# Patient Record
Sex: Female | Born: 1995 | Hispanic: Yes | Marital: Single | Smoking: Never smoker
Health system: Southern US, Community
[De-identification: ages and names within clinical notes are randomized; demographics above are authoritative.]

## PROBLEM LIST (undated history)

## (undated) DIAGNOSIS — J45909 Unspecified asthma, uncomplicated: Secondary | ICD-10-CM

## (undated) DIAGNOSIS — F79 Unspecified intellectual disabilities: Secondary | ICD-10-CM

---

## 2016-07-10 ENCOUNTER — Other Ambulatory Visit: Payer: Self-pay | Admitting: Gastroenterology

## 2016-07-10 DIAGNOSIS — R110 Nausea: Secondary | ICD-10-CM

## 2016-07-26 ENCOUNTER — Encounter (HOSPITAL_COMMUNITY)
Admission: RE | Admit: 2016-07-26 | Discharge: 2016-07-26 | Disposition: A | Discharge status: Home / Self Care | Payer: BLUE CROSS/BLUE SHIELD | Source: Ambulatory Visit | Attending: Gastroenterology | Admitting: Gastroenterology

## 2016-07-26 ENCOUNTER — Encounter (HOSPITAL_COMMUNITY): Payer: Self-pay | Admitting: Radiology

## 2016-07-26 DIAGNOSIS — R110 Nausea: Secondary | ICD-10-CM | POA: Diagnosis not present

## 2016-07-26 MED ORDER — TECHNETIUM TC 99M MEBROFENIN IV KIT
5.2000 | PACK | Freq: Once | INTRAVENOUS | Status: AC | PRN
  Administered 2016-07-26: 5.2 via INTRAVENOUS

## 2016-07-28 ENCOUNTER — Ambulatory Visit (INDEPENDENT_AMBULATORY_CARE_PROVIDER_SITE_OTHER): Payer: BLUE CROSS/BLUE SHIELD

## 2016-07-28 ENCOUNTER — Encounter (HOSPITAL_COMMUNITY): Payer: Self-pay | Admitting: Family Medicine

## 2016-07-28 ENCOUNTER — Ambulatory Visit (HOSPITAL_COMMUNITY)
Admission: EM | Admit: 2016-07-28 | Discharge: 2016-07-28 | Disposition: A | Discharge status: Home / Self Care | Payer: BLUE CROSS/BLUE SHIELD | Attending: Family Medicine | Admitting: Family Medicine

## 2016-07-28 DIAGNOSIS — S93402A Sprain of unspecified ligament of left ankle, initial encounter: Secondary | ICD-10-CM

## 2016-07-28 NOTE — Discharge Instructions (Signed)
Please take ibuprofen for pain relief Please rest the extremity Please follow up with your doctor if your ankle pain does not resolve

## 2016-07-28 NOTE — ED Triage Notes (Signed)
Pt here for pain and swelling to left ankle. sts that she fell on the bus yesterday.

## 2016-07-28 NOTE — ED Provider Notes (Signed)
CSN: [id number]     Arrival date & time 07/28/16  1401 History   None    Chief Complaint  Patient presents with  . Ankle Pain   (Consider location/radiation/quality/duration/timing/severity/associated sxs/prior Treatment) Patient is a healthy 21 year old female, is here for left ankle pain.  Patient was getting on the bus when she tripped on the steps and fell. Patient reports to have pain since the fall. Pain is tolerable currently. She reports limited ROM. She reports unable to ambulate. Patient have been applying ice and taking ibuprofen.       History reviewed. No pertinent past medical history. History reviewed. No pertinent surgical history. History reviewed. No pertinent family history. Social History  Substance Use Topics  . Smoking status: Never Smoker  . Smokeless tobacco: Never Used  . Alcohol use Not on file   OB History    No data available     Review of Systems  All other systems reviewed and are negative.   Allergies  Patient has no known allergies.  Home Medications   Prior to Admission medications   Not on File   Meds Ordered and Administered this Visit  Medications - No data to display  BP 132/81   Pulse 84   Temp 98.1 F (36.7 C)   Resp 18   SpO2 99%  No data found.   Physical Exam  Constitutional: She appears well-developed and well-nourished.  Cardiovascular: Normal rate.   Pulmonary/Chest: Effort normal.  Musculoskeletal:  Left ankle is swollen, tender to palpate over the lateral aspect; has limited ROM.   Skin: Skin is warm and dry.  Nursing note and vitals reviewed.   Urgent Care Course     Procedures (including critical care time)  Labs Review Labs Reviewed - No data to display  Imaging Review Dg Ankle Complete Left  Result Date: 07/28/2016 CLINICAL DATA:  Left ankle pain after injury last night EXAM: LEFT ANKLE COMPLETE - 3+ VIEW COMPARISON:  None. FINDINGS: Mild diffuse left ankle soft tissue swelling. No fracture or  subluxation. No suspicious focal osseous lesion. No appreciable degenerative or erosive arthropathy. No radiopaque foreign body. IMPRESSION: Mild diffuse left ankle soft tissue swelling, with no fracture or subluxation. Electronically Signed   By: Delbert PhenixJason A Poff M.D.   On: 07/28/2016 16:12    MDM   1. Sprain of left ankle, unspecified ligament, initial encounter    1) Xray unremarkable 2) Ankle brace applied to help with immobility 3) Take ibuprofen for pain relief 4) Rest the extremity 5) F/u with PCP if pain does not resolve in a few weeks.  6) Return as needed    Lucia EstelleFeng Sehaj Kolden, NP 07/28/16 1625

## 2016-10-19 ENCOUNTER — Other Ambulatory Visit: Payer: Self-pay

## 2016-10-19 ENCOUNTER — Encounter (HOSPITAL_COMMUNITY): Payer: Self-pay | Admitting: Emergency Medicine

## 2016-10-19 ENCOUNTER — Emergency Department (HOSPITAL_COMMUNITY)
Admission: EM | Admit: 2016-10-19 | Discharge: 2016-10-20 | Disposition: A | Discharge status: Home / Self Care | Payer: BLUE CROSS/BLUE SHIELD | Attending: Emergency Medicine | Admitting: Emergency Medicine

## 2016-10-19 ENCOUNTER — Emergency Department (HOSPITAL_COMMUNITY): Payer: BLUE CROSS/BLUE SHIELD

## 2016-10-19 DIAGNOSIS — J45909 Unspecified asthma, uncomplicated: Secondary | ICD-10-CM | POA: Insufficient documentation

## 2016-10-19 DIAGNOSIS — R079 Chest pain, unspecified: Secondary | ICD-10-CM | POA: Diagnosis present

## 2016-10-19 DIAGNOSIS — K219 Gastro-esophageal reflux disease without esophagitis: Secondary | ICD-10-CM | POA: Diagnosis not present

## 2016-10-19 DIAGNOSIS — R072 Precordial pain: Secondary | ICD-10-CM

## 2016-10-19 HISTORY — DX: Unspecified asthma, uncomplicated: J45.909

## 2016-10-19 HISTORY — DX: Unspecified intellectual disabilities: F79

## 2016-10-19 LAB — CBC WITH DIFFERENTIAL/PLATELET
BASOS ABS: 0 10*3/uL (ref 0.0–0.1)
BASOS PCT: 0 %
EOS ABS: 0.2 10*3/uL (ref 0.0–0.7)
EOS PCT: 1 %
HCT: 40.1 % (ref 36.0–46.0)
Hemoglobin: 13.6 g/dL (ref 12.0–15.0)
Lymphocytes Relative: 32 %
Lymphs Abs: 3.7 10*3/uL (ref 0.7–4.0)
MCH: 27.4 pg (ref 26.0–34.0)
MCHC: 33.9 g/dL (ref 30.0–36.0)
MCV: 80.7 fL (ref 78.0–100.0)
MONO ABS: 0.6 10*3/uL (ref 0.1–1.0)
Monocytes Relative: 5 %
Neutro Abs: 7.2 10*3/uL (ref 1.7–7.7)
Neutrophils Relative %: 62 %
PLATELETS: 379 10*3/uL (ref 150–400)
RBC: 4.97 MIL/uL (ref 3.87–5.11)
RDW: 14.4 % (ref 11.5–15.5)
WBC: 11.6 10*3/uL — AB (ref 4.0–10.5)

## 2016-10-19 LAB — BASIC METABOLIC PANEL
Anion gap: 12 (ref 5–15)
BUN: 9 mg/dL (ref 6–20)
CALCIUM: 8.9 mg/dL (ref 8.9–10.3)
CO2: 24 mmol/L (ref 22–32)
CREATININE: 0.94 mg/dL (ref 0.44–1.00)
Chloride: 102 mmol/L (ref 101–111)
GFR calc Af Amer: >60 mL/min (ref >60)
GLUCOSE: 153 mg/dL — AB (ref 65–99)
POTASSIUM: 3.1 mmol/L — AB (ref 3.5–5.1)
SODIUM: 138 mmol/L (ref 135–145)

## 2016-10-19 LAB — D-DIMER, QUANTITATIVE (NOT AT ARMC)

## 2016-10-19 LAB — I-STAT TROPONIN, ED: TROPONIN I, POC: 0 ng/mL (ref 0.00–0.08)

## 2016-10-19 NOTE — ED Triage Notes (Signed)
Per EMS; Pt was eating dinner and started to have CP.  Pt also c/o jaw hurting. A&Ox4 VSS Given 324 of ASP.   Poor historian

## 2016-10-19 NOTE — ED Provider Notes (Signed)
[facility]-EMERGENCY DEPT Provider Note   CSN: [id number] Arrival date & time: 10/19/16  2125     History   Chief Complaint Chief Complaint  Patient presents with  . Chest Pain    HPI Kelly Sloan is a 21 y.o. female.  Kelly Sloan is a 21 y.o. Female with history of MR and asthma who presents to the ED with her advisor from her school complaining of central chest pain since 7:30 pm tonight. Patient reports she was sitting eating dinner when she began having substernal and nonradiating chest pain. She denies any associated shortness of breath. She does report some associated burping. She denies any abdominal pain or vomiting. She denies history of MI or PE. No recent long travel. No personal or close family history of blood clotting disorder such as factor V Leiden, protein C or protein S deficiency. She is on endogenous estrogens on birth control pills. She denies fevers, coughing, shortness of breath, palpitations, leg pain, leg swelling, vomiting, diarrhea, abdominal pain, numbness, weakness or rashes.   The history is provided by the patient, medical records and a caregiver. No language interpreter was used.  Chest Pain   Associated symptoms include nausea. Pertinent negatives include no abdominal pain, no back pain, no cough, no fever, no headaches, no numbness, no palpitations, no shortness of breath, no vomiting and no weakness.    Past Medical History:  Diagnosis Date  . Asthma   . MR (mental retardation)     There are no active problems to display for this patient.   No past surgical history on file.  OB History    No data available       Home Medications    Prior to Admission medications   Medication Sig Start Date End Date Taking? Authorizing Provider  albuterol (PROVENTIL HFA;VENTOLIN HFA) 108 (90 Base) MCG/ACT inhaler Inhale 1-2 puffs into the lungs every 6 (six) hours as needed for wheezing or shortness of breath.   Yes Historical Provider, MD  Diethylpropion  HCl 25 MG TABS Take 1 tablet by mouth daily as needed (anxiety).    Yes Historical Provider, MD  escitalopram (LEXAPRO) 10 MG tablet Take 10 mg by mouth daily.   Yes Historical Provider, MD  levonorgestrel-ethinyl estradiol (SEASONALE,INTROVALE,JOLESSA) 0.15-0.03 MG tablet Take 1 tablet by mouth daily.   Yes Historical Provider, MD  naproxen (NAPROSYN) 250 MG tablet Take 1 tablet (250 mg total) by mouth 2 (two) times daily with a meal. 10/20/16   Everlene Farrier, PA-C  omeprazole (PRILOSEC) 20 MG capsule Take 1 capsule (20 mg total) by mouth daily. 10/20/16   Everlene Farrier, PA-C    Family History No family history on file.  Social History Social History  Substance Use Topics  . Smoking status: Never Smoker  . Smokeless tobacco: Never Used  . Alcohol use Not on file     Allergies   Patient has no known allergies.   Review of Systems Review of Systems  Constitutional: Negative for chills and fever.  HENT: Negative for congestion and sore throat.   Eyes: Negative for visual disturbance.  Respiratory: Negative for cough, shortness of breath and wheezing.   Cardiovascular: Positive for chest pain. Negative for palpitations and leg swelling.  Gastrointestinal: Positive for nausea. Negative for abdominal pain, diarrhea and vomiting.  Genitourinary: Negative for difficulty urinating and dysuria.  Musculoskeletal: Negative for back pain and neck pain.  Skin: Negative for rash.  Neurological: Negative for weakness, numbness and headaches.     Physical Exam  Updated Vital Signs BP 131/87   Pulse 74   Temp 97.8 F (36.6 C) (Oral)   Resp 15   Ht  (1.473 m)   Wt 63.5 kg   LMP 09/14/2016   SpO2 99%   BMI 29.26 kg/m   Physical Exam  Constitutional: She is oriented to person, place, and time. She appears well-developed and well-nourished. No distress.  Nontoxic appearing.  HENT:  Head: Normocephalic and atraumatic.  Mouth/Throat: Oropharynx is clear and moist.  Eyes:  Conjunctivae are normal. Pupils are equal, round, and reactive to light. Right eye exhibits no discharge. Left eye exhibits no discharge.  Neck: Normal range of motion. Neck supple. No JVD present. No tracheal deviation present.  Cardiovascular: Normal rate, regular rhythm, normal heart sounds and intact distal pulses.  Exam reveals no gallop and no friction rub.   No murmur heard. Bilateral radial, posterior tibialis and dorsalis pedis pulses are intact.    Pulmonary/Chest: Effort normal and breath sounds normal. No stridor. No respiratory distress. She has no wheezes. She has no rales. She exhibits tenderness.  Lungs are clear to ascultation bilaterally. Symmetric chest expansion bilaterally. No increased work of breathing. No rales or rhonchi.  Chest wall is TTP and reproduces her chest pain.   Abdominal: Soft. There is no tenderness.  Musculoskeletal: Normal range of motion. She exhibits no edema or tenderness.  No lower extremity edema or tenderness.  Lymphadenopathy:    She has no cervical adenopathy.  Neurological: She is alert and oriented to person, place, and time. Coordination normal.  Skin: Skin is warm and dry. Capillary refill takes less than 2 seconds. No rash noted. She is not diaphoretic. No erythema. No pallor.  Psychiatric: She has a normal mood and affect. Her behavior is normal.  Nursing note and vitals reviewed.    ED Treatments / Results  Labs (all labs ordered are listed, but only abnormal results are displayed) Labs Reviewed  BASIC METABOLIC PANEL - Abnormal; Notable for the following:       Result Value   Potassium 3.1 (*)    Glucose, Bld 153 (*)    All other components within normal limits  CBC WITH DIFFERENTIAL/PLATELET - Abnormal; Notable for the following:    WBC 11.6 (*)    All other components within normal limits  D-DIMER, QUANTITATIVE (NOT AT [facility])  I-STAT TROPOININ, ED    EKG  EKG Interpretation  Date/Time:  Friday October 19 2016 22:28:53  EDT Ventricular Rate:  73 PR Interval:    QRS Duration: 91 QT Interval:  362 QTC Calculation: 399 R Axis:   77 Text Interpretation:  Sinus rhythm Borderline low voltage, extremity leads No significant change since last tracing Confirmed by Anitra Lauth  MD, Alphonzo Lemmings ([id number]) on 10/19/2016 10:40:28 PM       Radiology Dg Chest 2 View  Result Date: 10/19/2016 CLINICAL DATA:  21 year old female with chest pain. EXAM: CHEST  2 VIEW COMPARISON:  None. FINDINGS: The heart size and mediastinal contours are within normal limits. Both lungs are clear. The visualized skeletal structures are unremarkable. IMPRESSION: No active cardiopulmonary disease. Electronically Signed   By: Elgie Collard M.D.   On: 10/19/2016 23:38    Procedures Procedures (including critical care time)  Medications Ordered in ED Medications - No data to display   Initial Impression / Assessment and Plan / ED Course  I have reviewed the triage vital signs and the nursing notes.  Pertinent labs & imaging results that were available during my  care of the patient were reviewed by me and considered in my medical decision making (see chart for details).    This  is a 21 y.o. Female with history of MR and asthma who presents to the ED with her advisor from her school complaining of central chest pain since 7:30 pm tonight. Patient reports she was sitting eating dinner when she began having substernal and nonradiating chest pain. She denies any associated shortness of breath. She does report some associated burping. She denies any abdominal pain or vomiting. She denies history of MI or PE. No recent long travel. No personal or close family history of blood clotting disorder such as factor V Leiden, protein C or protein S deficiency. She is on endogenous estrogens on birth control pills.  On exam the patient is afebrile and nontoxic appearing. Lungs clear to auscultation bilaterally. EKG is without evidence of ACS. No lower extremity  edema or tenderness. Substernal chest wall is tender to palpation reproduces her pain. Troponin is not elevated. BMP is unremarkable. CBC is unremarkable. D-dimer is not elevated. I have low suspicion for PE. I obtained a d-dimer due to patient being on endogenous estrogens. She has no hypoxia, tachypnea or tachycardia. Chest x-ray is unremarkable. At reevaluation patient reports her chest pain has resolved. She did receive a GI cocktail. We'll discharge the patient at this time with prescriptions for omeprazole and naproxen. I discussed strict and specific return precautions with the patient did also reviewed the test results with the patient and her mother. I advised the patient to follow-up with their primary care provider this week. I advised the patient to return to the emergency department with new or worsening symptoms or new concerns. The patient and her mother verbalized understanding and agreement with plan.     Final Clinical Impressions(s) / ED Diagnoses   Final diagnoses:  Precordial pain  Gastroesophageal reflux disease, esophagitis presence not specified    New Prescriptions New Prescriptions   NAPROXEN (NAPROSYN) 250 MG TABLET    Take 1 tablet (250 mg total) by mouth 2 (two) times daily with a meal.   OMEPRAZOLE (PRILOSEC) 20 MG CAPSULE    Take 1 capsule (20 mg total) by mouth daily.     Everlene Farrier, PA-C 10/20/16 0032    Tomasita Crumble, MD 10/20/16 [phone]

## 2016-10-20 MED ORDER — OMEPRAZOLE 20 MG PO CPDR: 20.0000 mg | DELAYED_RELEASE_CAPSULE | Freq: Every day | ORAL | 0 refills | Status: DC

## 2016-10-20 MED ORDER — NAPROXEN 250 MG PO TABS: 250.0000 mg | ORAL_TABLET | Freq: Two times a day (BID) | ORAL | 0 refills | Status: DC

## 2016-10-20 NOTE — ED Notes (Signed)
Pt denies any pain, says she just feels tired.

## 2016-10-20 NOTE — ED Notes (Signed)
Pt verbalized understanding discharge instructions and denies any further needs or questions at this time. VS stable, ambulatory and steady gait.   

## 2017-09-29 ENCOUNTER — Other Ambulatory Visit: Payer: Self-pay

## 2017-09-29 ENCOUNTER — Emergency Department (HOSPITAL_COMMUNITY)
Admission: EM | Admit: 2017-09-29 | Discharge: 2017-09-29 | Disposition: A | Discharge status: Home / Self Care | Payer: BLUE CROSS/BLUE SHIELD | Attending: Emergency Medicine | Admitting: Emergency Medicine

## 2017-09-29 ENCOUNTER — Encounter (HOSPITAL_COMMUNITY): Payer: Self-pay

## 2017-09-29 DIAGNOSIS — J45909 Unspecified asthma, uncomplicated: Secondary | ICD-10-CM | POA: Diagnosis not present

## 2017-09-29 DIAGNOSIS — R220 Localized swelling, mass and lump, head: Secondary | ICD-10-CM | POA: Insufficient documentation

## 2017-09-29 DIAGNOSIS — Z79899 Other long term (current) drug therapy: Secondary | ICD-10-CM | POA: Diagnosis not present

## 2017-09-29 LAB — RAPID STREP SCREEN (MED CTR MEBANE ONLY): Streptococcus, Group A Screen (Direct): NEGATIVE

## 2017-09-29 MED ORDER — ACETAMINOPHEN 325 MG PO TABS
650.0000 mg | ORAL_TABLET | Freq: Once | ORAL | Status: AC | PRN
  Administered 2017-09-29: 650 mg via ORAL
  Filled 2017-09-29: qty 2

## 2017-09-29 NOTE — ED Triage Notes (Signed)
States last night lower lip swelling and numbness and now less swollen and not numb. Clear speech noted moves all extremities.

## 2017-09-29 NOTE — ED Provider Notes (Signed)
[facility]-EMERGENCY DEPT Provider Note   CSN: [id number] Arrival date & time: 09/29/17  0537     History   Chief Complaint Chief Complaint  Patient presents with  . Oral Swelling    HPI Kelly Sloan is a 22 y.o. female.  HPI Patient reports developing swelling of her lower lip late this evening that awoke her from sleep.  No difficulty breathing or swallowing.  No history of allergic reactions.  No history of angioedema.  Patient is not on any blood pressure medications.  No recent NSAID use.  She states since waiting in the emergency department she has had almost near resolution of the swelling of her lower lip.  She is asymptomatic at this time.  She feels much better.  No other complaints.   Past Medical History:  Diagnosis Date  . Asthma   . MR (mental retardation)     There are no active problems to display for this patient.   History reviewed. No pertinent surgical history.   OB History   None      Home Medications    Prior to Admission medications   Medication Sig Start Date End Date Taking? Authorizing Provider  albuterol (PROVENTIL HFA;VENTOLIN HFA) 108 (90 Base) MCG/ACT inhaler Inhale 1-2 puffs into the lungs every 6 (six) hours as needed for wheezing or shortness of breath.    [provider]  Diethylpropion HCl 25 MG TABS Take 1 tablet by mouth daily as needed (anxiety).     [provider]  escitalopram (LEXAPRO) 10 MG tablet Take 10 mg by mouth daily.    [provider]  levonorgestrel-ethinyl estradiol (SEASONALE,INTROVALE,JOLESSA) 0.15-0.03 MG tablet Take 1 tablet by mouth daily.    [provider]  naproxen (NAPROSYN) 250 MG tablet Take 1 tablet (250 mg total) by mouth 2 (two) times daily with a meal. 10/20/16   Everlene Farrier, PA-C  omeprazole (PRILOSEC) 20 MG capsule Take 1 capsule (20 mg total) by mouth daily. 10/20/16   Everlene Farrier, PA-C    Family History History reviewed. No  pertinent family history.  Social History Social History   Tobacco Use  . Smoking status: Never Smoker  . Smokeless tobacco: Never Used  Substance Use Topics  . Alcohol use: Not on file  . Drug use: Not on file     Allergies   Patient has no allergy information on record.   Review of Systems Review of Systems  All other systems reviewed and are negative.    Physical Exam Updated Vital Signs BP 119/85 (BP Location: Right Arm)   Pulse 72   Temp 97.8 F (36.6 C) (Oral)   Resp 18   Ht 4\' 10"  (1.473 m)   Wt 63.5 kg (140 lb)   SpO2 100%   BMI 29.26 kg/m   Physical Exam  Constitutional: She is oriented to person, place, and time. She appears well-developed and well-nourished.  HENT:  Head: Normocephalic.  Tongue normal.  Space under her tongue is soft.  Dentition without acute abnormality.  No swelling of the upper or lower lip.  Tolerating secretions.  Posterior pharynx normal.  Oral airway patent.  No stridor.  Anterior neck normal  Eyes: EOM are normal.  Neck: Normal range of motion.  Cardiovascular: Normal rate.  Pulmonary/Chest: Effort normal.  Abdominal: She exhibits no distension.  Musculoskeletal: Normal range of motion.  Neurological: She is alert and oriented to person, place, and time.  Psychiatric: She has a normal mood and affect.  Nursing note and vitals reviewed.    ED Treatments / Results  Labs (all labs ordered are listed, but only abnormal results are displayed) Labs Reviewed  RAPID STREP SCREEN (NOT AT [facility])  CULTURE, GROUP A STREP [facility])    EKG None  Radiology No results found.  Procedures Procedures (including critical care time)  Medications Ordered in ED Medications  acetaminophen (TYLENOL) tablet 650 mg (650 mg Oral Given 09/29/17 [id number])     Initial Impression / Assessment and Plan / ED Course  I have reviewed the triage vital signs and the nursing notes.  Pertinent labs & imaging results that were available during my care  of the patient were reviewed by me and considered in my medical decision making (see chart for details).     May represent angioedema.  Regardless her symptoms have nearly completely resolved at this time.  There is no significant swelling on examination at this time.  Discharged home with precautions and instructions to return to the ER for new or worsening symptoms  Final Clinical Impressions(s) / ED Diagnoses   Final diagnoses:  Lip swelling    ED Discharge Orders    None       Azalia Bilisampos, Mayeli Bornhorst, MD 09/29/17 1009

## 2017-10-02 LAB — CULTURE, GROUP A STREP (THRC)

## 2018-07-12 ENCOUNTER — Encounter (HOSPITAL_COMMUNITY): Payer: Self-pay

## 2018-07-12 ENCOUNTER — Ambulatory Visit (HOSPITAL_COMMUNITY)
Admission: EM | Admit: 2018-07-12 | Discharge: 2018-07-12 | Disposition: A | Discharge status: Home / Self Care | Payer: BLUE CROSS/BLUE SHIELD | Attending: Family Medicine | Admitting: Family Medicine

## 2018-07-12 ENCOUNTER — Other Ambulatory Visit: Payer: Self-pay

## 2018-07-12 DIAGNOSIS — R69 Illness, unspecified: Secondary | ICD-10-CM | POA: Diagnosis not present

## 2018-07-12 DIAGNOSIS — J111 Influenza due to unidentified influenza virus with other respiratory manifestations: Secondary | ICD-10-CM

## 2018-07-12 DIAGNOSIS — R059 Cough, unspecified: Secondary | ICD-10-CM

## 2018-07-12 DIAGNOSIS — R05 Cough: Secondary | ICD-10-CM | POA: Insufficient documentation

## 2018-07-12 MED ORDER — HYDROCODONE-HOMATROPINE 5-1.5 MG/5ML PO SYRP
5.0000 mL | ORAL_SOLUTION | Freq: Four times a day (QID) | ORAL | 0 refills | Status: DC | PRN
Start: 2018-07-12 — End: 2019-04-11

## 2018-07-12 MED ORDER — OSELTAMIVIR PHOSPHATE 75 MG PO CAPS: 75.0000 mg | ORAL_CAPSULE | Freq: Two times a day (BID) | ORAL | 0 refills | Status: AC

## 2018-07-12 NOTE — ED Triage Notes (Signed)
Pt cc coughing, fever and body ache. This started yesterday.

## 2018-07-12 NOTE — Discharge Instructions (Signed)

## 2018-07-14 NOTE — ED Provider Notes (Signed)
[facility]   [id number] 07/12/18 Arrival Time: 1430  ASSESSMENT & PLAN:  1. Influenza-like illness   2. Cough    See AVS for discharge nstructions.  Meds ordered this encounter  Medications  . HYDROcodone-homatropine (HYCODAN) 5-1.5 MG/5ML syrup    Sig: Take 5 mLs by mouth every 6 (six) hours as needed for cough.    Dispense:  90 mL    Refill:  0  . oseltamivir (TAMIFLU) 75 MG capsule    Sig: Take 1 capsule (75 mg total) by mouth 2 (two) times daily for 5 days.    Dispense:  10 capsule    Refill:  0   [place] Controlled Substances Registry consulted for this patient. I feel the risk/benefit ratio today is favorable for proceeding with this prescription for a controlled substance. Medication sedation precautions given.  Discussed typical duration of symptoms. OTC symptom care as needed. Ensure adequate fluid intake and rest. May f/u with PCP or here as needed.  Reviewed expectations re: course of current medical issues. Questions answered. Outlined signs and symptoms indicating need for more acute intervention. Patient verbalized understanding. After Visit Summary given.   SUBJECTIVE: History from: patient.  Kelly Sloan is a 23 y.o. female who presents with complaint of nasal congestion, post-nasal drainage, and a persistent dry cough; without sore throat. Onset abrupt, yesterday; with fatigue and with mild body aches. SOB: none. Wheezing: none. No chest pains. Fever: yes, subjective. Overall normal PO intake without n/v. Known sick contacts: no. No specific or significant aggravating or alleviating factors reported. OTC treatment: none reported.  Received flu shot this year: no.  Social History   Tobacco Use  Smoking Status Never Smoker  Smokeless Tobacco Never Used    ROS: As per HPI. All other systems negative    OBJECTIVE:  Vitals:   07/12/18 1601 07/12/18 1603  BP:  110/68  Pulse:  93  Resp:  18  Temp:  98.3 F (36.8 C)  TempSrc:  Oral    SpO2:  99%  Weight: 88 kg      General appearance: alert; appears fatigued HEENT: nasal congestion; clear runny nose; throat irritation secondary to post-nasal drainage Neck: supple without LAD CV: RRR Lungs: unlabored respirations, symmetrical air entry without wheezing; cough: moderate Abd: soft Ext: no LE edema Skin: warm and dry Psychological: alert and cooperative; normal mood and affect  NKDA reported.  Past Medical History:  Diagnosis Date  . Asthma   . MR (mental retardation)     Social History   Socioeconomic History  . Marital status: Single    Spouse name: Not on file  . Number of children: Not on file  . Years of education: Not on file  . Highest education level: Not on file  Occupational History  . Not on file  Social Needs  . Financial resource strain: Not on file  . Food insecurity:    Worry: Not on file    Inability: Not on file  . Transportation needs:    Medical: Not on file    Non-medical: Not on file  Tobacco Use  . Smoking status: Never Smoker  . Smokeless tobacco: Never Used  Substance and Sexual Activity  . Alcohol use: Never    Frequency: Never  . Drug use: Never  . Sexual activity: Not on file  Lifestyle  . Physical activity:    Days per week: Not on file    Minutes per session: Not on file  . Stress: Not on file  Relationships  . Social connections:    Talks on phone: Not on file    Gets together: Not on file    Attends religious service: Not on file    Active member of club or organization: Not on file    Attends meetings of clubs or organizations: Not on file    Relationship status: Not on file  . Intimate partner violence:    Fear of current or ex partner: Not on file    Emotionally abused: Not on file    Physically abused: Not on file    Forced sexual activity: Not on file  Other Topics Concern  . Not on file  Social History Narrative  . Not on file           Mardella Layman, MD 07/14/18 [phone]

## 2019-04-11 ENCOUNTER — Other Ambulatory Visit: Payer: Self-pay

## 2019-04-11 ENCOUNTER — Encounter (HOSPITAL_COMMUNITY): Payer: Self-pay | Admitting: Emergency Medicine

## 2019-04-11 ENCOUNTER — Emergency Department (HOSPITAL_COMMUNITY)
Admission: EM | Admit: 2019-04-11 | Discharge: 2019-04-11 | Disposition: A | Discharge status: Home Health | Payer: BC Managed Care – PPO | Attending: Emergency Medicine | Admitting: Emergency Medicine

## 2019-04-11 DIAGNOSIS — F329 Major depressive disorder, single episode, unspecified: Secondary | ICD-10-CM | POA: Diagnosis not present

## 2019-04-11 DIAGNOSIS — F4323 Adjustment disorder with mixed anxiety and depressed mood: Secondary | ICD-10-CM | POA: Diagnosis present

## 2019-04-11 DIAGNOSIS — R440 Auditory hallucinations: Secondary | ICD-10-CM | POA: Diagnosis not present

## 2019-04-11 DIAGNOSIS — J45909 Unspecified asthma, uncomplicated: Secondary | ICD-10-CM | POA: Insufficient documentation

## 2019-04-11 DIAGNOSIS — Z79899 Other long term (current) drug therapy: Secondary | ICD-10-CM | POA: Diagnosis not present

## 2019-04-11 DIAGNOSIS — F419 Anxiety disorder, unspecified: Secondary | ICD-10-CM | POA: Diagnosis present

## 2019-04-11 DIAGNOSIS — F4322 Adjustment disorder with anxiety: Secondary | ICD-10-CM | POA: Diagnosis not present

## 2019-04-11 LAB — RAPID URINE DRUG SCREEN, HOSP PERFORMED
Amphetamines: NOT DETECTED
Barbiturates: NOT DETECTED
Benzodiazepines: NOT DETECTED
Cocaine: NOT DETECTED
Opiates: NOT DETECTED
Tetrahydrocannabinol: NOT DETECTED

## 2019-04-11 LAB — COMPREHENSIVE METABOLIC PANEL WITH GFR
ALT: 20 U/L (ref 0–44)
AST: 22 U/L (ref 15–41)
Albumin: 3.5 g/dL (ref 3.5–5.0)
Alkaline Phosphatase: 163 U/L — ABNORMAL HIGH (ref 38–126)
Anion gap: 12 (ref 5–15)
BUN: 14 mg/dL (ref 6–20)
CO2: 22 mmol/L (ref 22–32)
Calcium: 8.9 mg/dL (ref 8.9–10.3)
Chloride: 105 mmol/L (ref 98–111)
Creatinine, Ser: 0.7 mg/dL (ref 0.44–1.00)
GFR calc Af Amer: >60 mL/min
GFR calc non Af Amer: >60 mL/min
Glucose, Bld: 93 mg/dL (ref 70–99)
Potassium: 3.4 mmol/L — ABNORMAL LOW (ref 3.5–5.1)
Sodium: 139 mmol/L (ref 135–145)
Total Bilirubin: 0.5 mg/dL (ref 0.3–1.2)
Total Protein: 7.4 g/dL (ref 6.5–8.1)

## 2019-04-11 LAB — CBC
HCT: 45 % (ref 36.0–46.0)
Hemoglobin: 14.8 g/dL (ref 12.0–15.0)
MCH: 27 pg (ref 26.0–34.0)
MCHC: 32.9 g/dL (ref 30.0–36.0)
MCV: 82 fL (ref 80.0–100.0)
Platelets: 455 K/uL — ABNORMAL HIGH (ref 150–400)
RBC: 5.49 MIL/uL — ABNORMAL HIGH (ref 3.87–5.11)
RDW: 13.6 % (ref 11.5–15.5)
WBC: 11.1 K/uL — ABNORMAL HIGH (ref 4.0–10.5)
nRBC: 0 % (ref 0.0–0.2)

## 2019-04-11 LAB — I-STAT BETA HCG BLOOD, ED (MC, WL, AP ONLY): I-stat hCG, quantitative: <5 m[IU]/mL

## 2019-04-11 LAB — ACETAMINOPHEN LEVEL: Acetaminophen (Tylenol), Serum: <10 ug/mL — ABNORMAL LOW (ref 10–30)

## 2019-04-11 LAB — SALICYLATE LEVEL: Salicylate Lvl: <7 mg/dL (ref 2.8–30.0)

## 2019-04-11 LAB — ETHANOL: Alcohol, Ethyl (B): <10 mg/dL

## 2019-04-11 MED ORDER — GUANFACINE HCL ER 1 MG PO TB24
1.0000 mg | ORAL_TABLET | Freq: Every day | ORAL | Status: DC
  Filled 2019-04-11: qty 1

## 2019-04-11 MED ORDER — ESCITALOPRAM OXALATE 10 MG PO TABS
10.0000 mg | ORAL_TABLET | Freq: Every day | ORAL | Status: DC
  Filled 2019-04-11: qty 1

## 2019-04-11 MED ORDER — MELATONIN 5 MG PO TABS: 5.0000 mg | ORAL_TABLET | Freq: Every day | ORAL | Status: DC

## 2019-04-11 NOTE — ED Provider Notes (Signed)
[facility]-EMERGENCY DEPT Provider Note   CSN: [id number] Arrival date & time: 04/11/19  5053     History   Chief Complaint Chief Complaint  Patient presents with  . Anxiety    HPI Kelly Sloan is a 23 y.o. female with past medical history significant for anxiety, depression, MR, asthma presents to emergency department today with chief complaint of anxiety.  Patient states she goes through episodes when her anxiety is much worse and she is unable to control it. She states this morning the episode started and she has not been able to get anxiety under control. She denies being suicidal when asked about a plan she states she has had thoughts of killing herself with either a weapon or pills.  She admits to hearing voices that tell her negative thoughts about herself.  She has a therapist in [place] and had a video visit last week and thought her anxiety and depression were well controlled at that time. She does not know what triggers her anxiety today. She denies feeling homicidal. She denies any alcohol consumption or drug use. She has no physical complaints at this time.  History provided by patient with additional history obtained from chart review.       Past Medical History:  Diagnosis Date  . Asthma   . MR (mental retardation)     Patient Active Problem List   Diagnosis Date Noted  . Adjustment disorder with mixed anxiety and depressed mood 04/11/2019    History reviewed. No pertinent surgical history.   OB History   No obstetric history on file.      Home Medications    Prior to Admission medications   Medication Sig Start Date End Date Taking? Authorizing Provider  clonazePAM (KLONOPIN) 0.5 MG tablet Take 0.5 mg by mouth 2 (two) times daily as needed for anxiety.   Yes [provider]  escitalopram (LEXAPRO) 10 MG tablet Take 10 mg by mouth daily.   Yes [provider]  guanFACINE (INTUNIV) 1 MG TB24 ER tablet Take 1 mg  by mouth daily. 06/21/18  Yes [provider]  levonorgestrel-ethinyl estradiol (SEASONALE,INTROVALE,JOLESSA) 0.15-0.03 MG tablet Take 1 tablet by mouth daily.   Yes [provider]  Melatonin 5 MG TABS Take 5 mg by mouth at bedtime.   Yes [provider]    Family History No family history on file.  Social History Social History   Tobacco Use  . Smoking status: Never Smoker  . Smokeless tobacco: Never Used  Substance Use Topics  . Alcohol use: Never    Frequency: Never  . Drug use: Never     Allergies   Patient has no known allergies.   Review of Systems Review of Systems  Constitutional: Negative for chills and fever.  HENT: Negative for congestion, ear discharge, ear pain, sinus pressure, sinus pain and sore throat.   Eyes: Negative for pain and redness.  Respiratory: Negative for cough and shortness of breath.   Cardiovascular: Negative for chest pain.  Gastrointestinal: Negative for abdominal pain, constipation, diarrhea, nausea and vomiting.  Genitourinary: Negative for dysuria and hematuria.  Musculoskeletal: Negative for back pain and neck pain.  Skin: Negative for wound.  Neurological: Negative for weakness, numbness and headaches.  Psychiatric/Behavioral: Positive for suicidal ideas. The patient is nervous/anxious.      Physical Exam Updated Vital Signs BP (!) 134/92 (BP Location: Left Arm)   Pulse 73   Temp 98.5 F (36.9 C) (Oral)   Resp 16  SpO2 92%   Physical Exam Vitals signs and nursing note reviewed.  Constitutional:      General: She is not in acute distress.    Appearance: She is not ill-appearing.  HENT:     Head: Normocephalic and atraumatic.     Right Ear: Tympanic membrane and external ear normal.     Left Ear: Tympanic membrane and external ear normal.     Nose: Nose normal.     Mouth/Throat:     Mouth: Mucous membranes are moist.     Pharynx: Oropharynx is clear.  Eyes:     General: No scleral icterus.        Right eye: No discharge.        Left eye: No discharge.     Extraocular Movements: Extraocular movements intact.     Conjunctiva/sclera: Conjunctivae normal.     Pupils: Pupils are equal, round, and reactive to light.  Neck:     Musculoskeletal: Normal range of motion.     Vascular: No JVD.  Cardiovascular:     Rate and Rhythm: Normal rate and regular rhythm.     Pulses: Normal pulses.          Radial pulses are 2+ on the right side and 2+ on the left side.     Heart sounds: Normal heart sounds.  Pulmonary:     Comments: Lungs clear to auscultation in all fields. Symmetric chest rise. No wheezing, rales, or rhonchi. Abdominal:     Comments: Abdomen is soft, non-distended, and non-tender in all quadrants. No rigidity, no guarding. No peritoneal signs.  Musculoskeletal: Normal range of motion.  Skin:    General: Skin is warm and dry.     Capillary Refill: Capillary refill takes less than 2 seconds.  Neurological:     Mental Status: She is oriented to person, place, and time.     GCS: GCS eye subscore is 4. GCS verbal subscore is 5. GCS motor subscore is 6.     Comments: Fluent speech, no facial droop.  Psychiatric:        Attention and Perception: Attention normal.        Mood and Affect: Affect is flat.        Speech: Speech normal.        Behavior: Behavior normal.        Thought Content: Thought content includes suicidal ideation. Thought content does not include homicidal ideation. Thought content includes suicidal plan. Thought content does not include homicidal plan.      ED Treatments / Results  Labs (all labs ordered are listed, but only abnormal results are displayed) Labs Reviewed  COMPREHENSIVE METABOLIC PANEL - Abnormal; Notable for the following components:      Result Value   Potassium 3.4 (*)    Alkaline Phosphatase 163 (*)    All other components within normal limits  ACETAMINOPHEN LEVEL - Abnormal; Notable for the following components:    Acetaminophen (Tylenol), Serum <10 (*)    All other components within normal limits  CBC - Abnormal; Notable for the following components:   WBC 11.1 (*)    RBC 5.49 (*)    Platelets 455 (*)    All other components within normal limits  ETHANOL  SALICYLATE LEVEL  RAPID URINE DRUG SCREEN, HOSP PERFORMED  I-STAT BETA HCG BLOOD, ED ([facility], [facility], [facility] ONLY)    EKG None  Radiology No results found.  Procedures Procedures (including critical care time)  Medications Ordered in ED Medications  escitalopram (LEXAPRO) tablet 10 mg (10 mg Oral Refused 04/11/19 1258)  guanFACINE (INTUNIV) ER tablet 1 mg (1 mg Oral Refused 04/11/19 1405)  Melatonin TABS 5 mg (has no administration in time range)     Initial Impression / Assessment and Plan / ED Course  I have reviewed the triage vital signs and the nursing notes.  Pertinent labs & imaging results that were available during my care of the patient were reviewed by me and considered in my medical decision making (see chart for details).  No medical complaints today.  Labs ordered and reviewed. They are unremarkable. Pt is medically cleared for TTS evaluation.  [facility] patient's mother has been contacted.  She is requesting that patient be discharged home as she does not feel daughter is a harm or danger to herself.  Patient is also requesting to be discharged home and contracts for safety.  On reassessment pt denies suicidal ideations. She is stable to be discharged home after observation.  The patient appears reasonably screened and/or stabilized for discharge and I doubt any other medical condition or other [facility] requiring further screening, evaluation, or treatment in the ED at this time prior to discharge. The patient is safe for discharge with strict return precautions discussed. Recommend outpatient psych follow up.   Portions of this note were generated with Scientist, clinical (histocompatibility and immunogenetics)Dragon dictation software. Dictation errors may occur despite best attempts at  proofreading.   Final Clinical Impressions(s) / ED Diagnoses   Final diagnoses:  Anxiety    ED Discharge Orders    None       Kathyrn Lasslbrizze, Saban Heinlen E, PA-C 04/11/19 1617    Linwood DibblesKnapp, Jon, MD 04/12/19 1002

## 2019-04-11 NOTE — ED Notes (Signed)
Pt refused Lexpro, she was anxious and says she takes only in AM. Pt will take the mediation in the AM.  Pt refused Intuniv, pt stated that she takes this medication every other day now.

## 2019-04-11 NOTE — Discharge Instructions (Addendum)
You have been seen today for anxiety. Please read and follow all provided instructions. Return to the emergency room for worsening condition or new concerning symptoms. Please call 911 if you have feelings of hurting yourself or others.  1. Medications: Continue usual home medications  2. Treatment: rest, drink plenty of fluids  3. Follow Up: Please follow up with your psychiatrist in the 2-5 days ?

## 2019-04-11 NOTE — BH Assessment (Signed)
[place] Assessment Progress Note This Probation officer along with Darleene Cleaver MD, Jerelene Redden NP spoke with patient's mother Sharlee Rufino [phone] who is requesting patient be discharged this date. Patient is also requesting to be discharged and contracts for safety. Patient's mother states she does not feel patient is a danger to herself. Patient will be discharged later this date.

## 2019-04-11 NOTE — ED Notes (Signed)
Pt DC d off unit to home per provider. Pt alert, calm, cooperative, no s/s of distress. DC information given to and reviewed with pt. Belongings given to pt. Pt ambulatory, escorted by NT. Pt talked to mom and is aware using uber for transport.  Pt used uber for transport. NT escorted pt to uber.

## 2019-04-11 NOTE — ED Triage Notes (Addendum)
Patient here from [place] reporting anxiety. States that episode started 2 hours ago. Patient crying in triage. Does not know why episode started. Patient is MR. Reports thoughts of hurting self "but would never actually do it".

## 2019-04-11 NOTE — BH Assessment (Addendum)
Assessment Note  Kelly Sloan is an 23 y.o. female that presents this date with S/I. Patient does not voice a plan although states "she would probably take pills" if she "ever did harm herself." Patient denies she would "actually ever do it." Patient denies any H/I or VH. Patient reports active AH although patient denies any previous history stating this date the voices might be "self talk." Patient states "she just hears herself talking" sometimes. Patient denies any previous attempts or gestures of self harm. Patient has a history of depression, IDD and anxiety that presents today with chief complaint of anxiety. Patient states she goes through episodes when her anxiety is much worse and she is unable to control it. She states this morning the episode started and she has not been able to get anxiety under control. She denies being suicidal when asked about a plan she states she has had thoughts of killing herself with either a weapon or pills. She admits to hearing voices that tell her negative thoughts about herself. She has a therapist in [place] and had been seeing that counselor through tele-psych visits. Patient also reports she has a OP provider Schacter MD who assists with medication management. Patient reports current compliance. 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 Patient reports she has a learning disability. Patient is observed to be anxious during the assessment and has difficulty rendering her history. Collateral information was obtained from her mother Pamelia Hoit [phone] who resides in [place]. Parent states that patient often suffers from these bouts of anxiety and they usually self resolve. Parent states she feels patient would be safe if discharged this date. Patient cannot identify any immediate triggers that led to current incident. Patient's mother reports patient is IDD although psych-metric testing is not available at this  time. Patient denies any SA use. Patient is oriented x 4 and presents with a anxious affect. Patient's mood is depressed as she attempts to render history. Case was staffed with Akintayo MD who recommended patient be observed and monitored for safety.    Diagnosis: F33.2 MDD recurrent without psychotic features, severe, GAD  Past Medical History:  Past Medical History:  Diagnosis Date  . Asthma   . MR (mental retardation)     History reviewed. No pertinent surgical history.  Family History: No family history on file.  Social History:  reports that she has never smoked. She has never used smokeless tobacco. She reports that she does not drink alcohol or use drugs.  Additional Social History:  Alcohol / Drug Use Pain Medications: See MAR Prescriptions: See MAR Over the Counter: See MAR History of alcohol / drug use?: No history of alcohol / drug abuse  CIWA: CIWA-Ar BP: (!) 154/98 Pulse Rate: 89 COWS:    Allergies: No Known Allergies  Home Medications: (Not in a hospital admission)   OB/GYN Status:  No LMP recorded. (Menstrual status: Oral contraceptives).  General Assessment Data [facility] of Assessment: [facility] TTS Assessment: In system Is this a Tele or Face-to-Face Assessment?: Face-to-Face Is this an Initial Assessment or a Re-assessment for this encounter?: Initial Assessment Patient Accompanied by:: N/A Language Other than English: No Living Arrangements: Other (Comment) What gender do you identify as?: Female Marital status: Single Pregnancy Status: No Living Arrangements: Non-relatives/Friends Can pt return to current living arrangement?: Yes Admission Status: Voluntary Is patient capable of signing voluntary admission?: Yes Referral Source: Self/Family/Friend Insurance type: Insurance risk surveyor Exam [facility]  Walk-in ONLY) Medical Exam completed: Yes  Crisis Care Plan Living Arrangements: Non-relatives/Friends Legal Guardian: (NA) Name of Psychiatrist:  Schacter MD Name of Therapist: None  Education Status Is patient currently in school?: Yes Current Grade: (Senior [employer]) Highest grade of school patient has completed: (3rd year [employer]) Name of school: [employer] Contact person: NA IEP information if applicable: (NA)  Risk to self with the past 6 months Suicidal Ideation: Yes-Currently Present Has patient been a risk to self within the past 6 months prior to admission? : No Suicidal Intent: No Has patient had any suicidal intent within the past 6 months prior to admission? : No Is patient at risk for suicide?: No Suicidal Plan?: No Has patient had any suicidal plan within the past 6 months prior to admission? : No Access to Means: No What has been your use of drugs/alcohol within the last 12 months?: Denies Previous Attempts/Gestures: No How many times?: 0 Other Self Harm Risks: NA Triggers for Past Attempts: (NA) Intentional Self Injurious Behavior: None Family Suicide History: No Recent stressful life event(s): (Relationship issues ) Persecutory voices/beliefs?: No Depression: Yes Depression Symptoms: (Pt is vague in reference symptoms ) Substance abuse history and/or treatment for substance abuse?: No Suicide prevention information given to non-admitted patients: Not applicable  Risk to Others within the past 6 months Homicidal Ideation: No Does patient have any lifetime risk of violence toward others beyond the six months prior to admission? : No Thoughts of Harm to Others: No Current Homicidal Intent: No Current Homicidal Plan: No Access to Homicidal Means: No Identified Victim: NA History of harm to others?: No Assessment of Violence: None Noted Violent Behavior Description: NA Does patient have access to weapons?: No Criminal Charges Pending?: No Does patient have a court date: No Is patient on probation?: No  Psychosis Hallucinations: None noted Delusions: None noted  Mental Status Report Appearance/Hygiene: In  scrubs Eye Contact: Good Motor Activity: Freedom of movement Speech: Logical/coherent Level of Consciousness: Quiet/awake Mood: Pleasant Affect: Appropriate to circumstance Anxiety Level: Moderate Thought Processes: Coherent, Relevant Judgement: Partial Orientation: Person, Place, Time Obsessive Compulsive Thoughts/Behaviors: None  Cognitive Functioning Concentration: Decreased Memory: Recent Intact, Remote Intact Is patient IDD: Yes Level of Function: (Per Mother 5860 ) Is IQ score available?: No Insight: Fair Impulse Control: Fair Appetite: Good Have you had any weight changes? : No Change Sleep: No Change Total Hours of Sleep: 7 Vegetative Symptoms: None  ADLScreening [facility] Assessment Services) Patient's cognitive ability adequate to safely complete daily activities?: Yes Patient able to express need for assistance with ADLs?: Yes Independently performs ADLs?: Yes (appropriate for developmental age)  Prior Inpatient Therapy Prior Inpatient Therapy: No  Prior Outpatient Therapy Prior Outpatient Therapy: Yes Prior Therapy Dates: Ongoing Prior Therapy Facilty/Provider(s): Schacter MD Reason for Treatment: Med mang Does patient have an ACCT team?: No Does patient have Intensive In-House Services?  : No Does patient have [facility] services? : No Does patient have P4CC services?: No  ADL Screening (condition at time of admission) Patient's cognitive ability adequate to safely complete daily activities?: Yes Is the patient deaf or have difficulty hearing?: No Does the patient have difficulty seeing, even when wearing glasses/contacts?: No Does the patient have difficulty concentrating, remembering, or making decisions?: Yes Patient able to express need for assistance with ADLs?: Yes Does the patient have difficulty dressing or bathing?: No Independently performs ADLs?: Yes (appropriate for developmental age) Does the patient have difficulty walking or climbing stairs?:  No Weakness of Legs: None Weakness of  Arms/Hands: None  Home Assistive Devices/Equipment Home Assistive Devices/Equipment: None  Therapy Consults (therapy consults require a physician order) PT Evaluation Needed: No OT Evalulation Needed: No SLP Evaluation Needed: No Abuse/Neglect Assessment (Assessment to be complete while patient is alone) Abuse/Neglect Assessment Can Be Completed: Yes Physical Abuse: Denies Verbal Abuse: Denies Sexual Abuse: Denies Exploitation of patient/patient's resources: Denies Self-Neglect: Denies Values / Beliefs Cultural Requests During Hospitalization: None Spiritual Requests During Hospitalization: None Consults Spiritual Care Consult Needed: No Social Work Consult Needed: No Merchant navy officer (For Healthcare) Does Patient Have a Medical Advance Directive?: No Would patient like information on creating a medical advance directive?: No - Patient declined          Disposition: Case was staffed with Akintayo MD who recommended patient be observed and monitored for safety.    Disposition Initial Assessment Completed for this Encounter: Yes  On Site Evaluation by:   Reviewed with Physician:    Alfredia Ferguson 04/11/2019 11:36 AM

## 2019-08-20 ENCOUNTER — Emergency Department (HOSPITAL_COMMUNITY): Payer: BC Managed Care – PPO

## 2019-08-20 ENCOUNTER — Encounter (HOSPITAL_COMMUNITY): Payer: Self-pay | Admitting: Emergency Medicine

## 2019-08-20 ENCOUNTER — Other Ambulatory Visit: Payer: Self-pay

## 2019-08-20 ENCOUNTER — Emergency Department (HOSPITAL_COMMUNITY)
Admission: EM | Admit: 2019-08-20 | Discharge: 2019-08-20 | Disposition: A | Discharge status: Home / Self Care | Payer: BC Managed Care – PPO | Attending: Emergency Medicine | Admitting: Emergency Medicine

## 2019-08-20 DIAGNOSIS — R1084 Generalized abdominal pain: Secondary | ICD-10-CM

## 2019-08-20 DIAGNOSIS — J45909 Unspecified asthma, uncomplicated: Secondary | ICD-10-CM | POA: Diagnosis not present

## 2019-08-20 DIAGNOSIS — R1010 Upper abdominal pain, unspecified: Secondary | ICD-10-CM | POA: Diagnosis present

## 2019-08-20 DIAGNOSIS — Z79899 Other long term (current) drug therapy: Secondary | ICD-10-CM | POA: Diagnosis not present

## 2019-08-20 LAB — URINALYSIS, ROUTINE W REFLEX MICROSCOPIC
Bilirubin Urine: NEGATIVE
Glucose, UA: NEGATIVE mg/dL
Hgb urine dipstick: NEGATIVE
Ketones, ur: NEGATIVE mg/dL
Nitrite: NEGATIVE
Protein, ur: NEGATIVE mg/dL
Specific Gravity, Urine: 1.019 (ref 1.005–1.030)
pH: 6 (ref 5.0–8.0)

## 2019-08-20 LAB — CBC
HCT: 44.2 % (ref 36.0–46.0)
Hemoglobin: 14.1 g/dL (ref 12.0–15.0)
MCH: 26.3 pg (ref 26.0–34.0)
MCHC: 31.9 g/dL (ref 30.0–36.0)
MCV: 82.5 fL (ref 80.0–100.0)
Platelets: 485 10*3/uL — ABNORMAL HIGH (ref 150–400)
RBC: 5.36 MIL/uL — ABNORMAL HIGH (ref 3.87–5.11)
RDW: 15.2 % (ref 11.5–15.5)
WBC: 11.5 10*3/uL — ABNORMAL HIGH (ref 4.0–10.5)
nRBC: 0 % (ref 0.0–0.2)

## 2019-08-20 LAB — COMPREHENSIVE METABOLIC PANEL
ALT: 21 U/L (ref 0–44)
AST: 23 U/L (ref 15–41)
Albumin: 3.4 g/dL — ABNORMAL LOW (ref 3.5–5.0)
Alkaline Phosphatase: 176 U/L — ABNORMAL HIGH (ref 38–126)
Anion gap: 9 (ref 5–15)
BUN: 14 mg/dL (ref 6–20)
CO2: 27 mmol/L (ref 22–32)
Calcium: 9.1 mg/dL (ref 8.9–10.3)
Chloride: 104 mmol/L (ref 98–111)
Creatinine, Ser: 0.73 mg/dL (ref 0.44–1.00)
GFR calc Af Amer: >60 mL/min (ref >60)
GFR calc non Af Amer: >60 mL/min (ref >60)
Glucose, Bld: 103 mg/dL — ABNORMAL HIGH (ref 70–99)
Potassium: 3.7 mmol/L (ref 3.5–5.1)
Sodium: 140 mmol/L (ref 135–145)
Total Bilirubin: 0.6 mg/dL (ref 0.3–1.2)
Total Protein: 7.7 g/dL (ref 6.5–8.1)

## 2019-08-20 LAB — I-STAT BETA HCG BLOOD, ED (MC, WL, AP ONLY): I-stat hCG, quantitative: <5 m[IU]/mL (ref <5)

## 2019-08-20 LAB — LIPASE, BLOOD: Lipase: 29 U/L (ref 11–51)

## 2019-08-20 MED ORDER — SODIUM CHLORIDE 0.9 % IV BOLUS
1000.0000 mL | Freq: Once | INTRAVENOUS | Status: AC
  Administered 2019-08-20: 1000 mL via INTRAVENOUS

## 2019-08-20 MED ORDER — IOHEXOL 300 MG/ML  SOLN
100.0000 mL | Freq: Once | INTRAMUSCULAR | Status: AC | PRN
  Administered 2019-08-20: 100 mL via INTRAVENOUS

## 2019-08-20 MED ORDER — SODIUM CHLORIDE (PF) 0.9 % IJ SOLN
INTRAMUSCULAR | Status: AC
  Filled 2019-08-20: qty 50

## 2019-08-20 NOTE — ED Notes (Signed)
Pt ambulated to and from restroom without assistance. EDP Dykstra at bedside.

## 2019-08-20 NOTE — Discharge Instructions (Signed)
Recommend following up both with your primary care doctor as well as your gastroenterologist.  Recommend trial of MiraLAX once daily for the next week.  If your pain worsens, you develop vomiting, fever, lower abdominal pain or other new concerning symptom, recommend return to ER for reassessment.

## 2019-08-20 NOTE — ED Provider Notes (Signed)
[facility] DEPT Provider Note   CSN: [id number] Arrival date & time: 08/20/19  1141     History Chief Complaint  Patient presents with  . Abdominal Pain  . Nausea    Ariannie Penaloza is a 24 y.o. female.  HPI  24 year old female with developmental delay presents today stating that she had onset sudden onset of abdominal pain today while she was at school on the [place].  She says points to the upper half of her abdomen.  She states that she ate breakfast and however not have any difficulty and not been ill.  She was walking across campus when she had sudden severe onset of pain.  She states that she has been sexually active but has used condoms and is on birth control..  She does not know the exact date of her last menstrual cycle but states that she should be starting her period any day now.  She denies headache, fever, chest pain, vomiting, diarrhea, painful or abnormal urination or vaginal discharge. Additional history obtained from mother.    Past Medical History:  Diagnosis Date  . Asthma   . MR (mental retardation)     Patient Active Problem List   Diagnosis Date Noted  . Adjustment disorder with mixed anxiety and depressed mood 04/11/2019    History reviewed. No pertinent surgical history.   OB History   No obstetric history on file.     No family history on file.  Social History   Tobacco Use  . Smoking status: Never Smoker  . Smokeless tobacco: Never Used  Substance Use Topics  . Alcohol use: Never  . Drug use: Never    Home Medications Prior to Admission medications   Medication Sig Start Date End Date Taking? Authorizing Provider  clonazePAM (KLONOPIN) 0.5 MG tablet Take 0.5 mg by mouth 2 (two) times daily as needed for anxiety.    [provider]  escitalopram (LEXAPRO) 10 MG tablet Take 10 mg by mouth daily.    [provider]  guanFACINE (INTUNIV) 1 MG TB24 ER tablet Take 1 mg by mouth daily.  06/21/18   [provider]  levonorgestrel-ethinyl estradiol (SEASONALE,INTROVALE,JOLESSA) 0.15-0.03 MG tablet Take 1 tablet by mouth daily.    [provider]  Melatonin 5 MG TABS Take 5 mg by mouth at bedtime.    [provider]    Allergies    Patient has no known allergies.  Review of Systems   Review of Systems  All other systems reviewed and are negative.   Physical Exam Updated Vital Signs BP (!) 123/97 (BP Location: Right Arm)   Pulse 86   Temp 98.2 F (36.8 C) (Oral)   Resp 19   Ht 1.473 m (4' 10" )   Wt 91.6 kg   SpO2 100%   BMI 42.22 kg/m   Physical Exam Vitals and nursing note reviewed.  Constitutional:      Appearance: She is well-developed.  HENT:     Head: Normocephalic.     Mouth/Throat:     Mouth: Mucous membranes are moist.  Eyes:     Extraocular Movements: Extraocular movements intact.  Cardiovascular:     Rate and Rhythm: Normal rate and regular rhythm.     Heart sounds: Normal heart sounds.  Pulmonary:     Effort: Pulmonary effort is normal.     Breath sounds: Normal breath sounds.  Abdominal:     General: Abdomen is flat. Bowel sounds are normal.  Palpations: Abdomen is soft.  Skin:    General: Skin is warm and dry.     Capillary Refill: Capillary refill takes less than 2 seconds.  Neurological:     General: No focal deficit present.     Mental Status: She is alert.     ED Results / Procedures / Treatments   Labs (all labs ordered are listed, but only abnormal results are displayed) Labs Reviewed  LIPASE, BLOOD  COMPREHENSIVE METABOLIC PANEL  CBC  URINALYSIS, ROUTINE W REFLEX MICROSCOPIC  I-STAT BETA HCG BLOOD, ED ([facility], [facility], [facility] ONLY)  I-STAT BETA HCG BLOOD, ED ([facility], [facility], [facility] ONLY)    EKG None  Radiology No results found.  Procedures Procedures (including critical care time)  Medications Ordered in ED Medications  sodium chloride 0.9 % bolus 1,000 mL (has no administration in time range)    ED  Course  I have reviewed the triage vital signs and the nursing notes.  Pertinent labs & imaging results that were available during my care of the patient were reviewed by me and considered in my medical decision making (see chart for details).   MDM Rules/Calculators/A&P                     Patient presents today with sudden onset of abdominal pain.  Labs are normal except for alk phos elevated at 176 and mild leukocytosis at 11,500. Pregnancy test is negative.  Urinalysis with 0-5 white blood cells and small leukocytosis Patient care signed out to Dr. Roslynn Amble. Final Clinical Impression(s) / ED Diagnoses Final diagnoses:  Generalized abdominal pain    Rx / DC Orders ED Discharge Orders    None       Pattricia Boss, MD 08/20/19 1542

## 2019-08-20 NOTE — ED Provider Notes (Signed)
24 year old lady medical history developmental delay presenting to ER with complaint of abdominal pain.  Noted soft abdomen, generalized tenderness on exam, worse in upper abdomen.  3:30 PM Received signout from Dr. Rosalia Hammers  4:18 PM recheck patient, reviewed CT results with patient and mother, abdominal pain gone at this time, confirmed pain was in upper abdomen, no lower abdominal pain or pelvic pain.  CT scan negative.  Suspect gas pains, constipation or gastritis.  Patient has been seen by GI previously per mother.  Strongly recommend follow-up with GI and PCP.    After the discussed management above, the patient was determined to be safe for discharge.  The patient was in agreement with this plan and all questions regarding their care were answered.  ED return precautions were discussed and the patient will return to the ED with any significant worsening of condition.      Kelly Loll, MD 08/20/19 1620

## 2019-08-20 NOTE — ED Notes (Signed)
Patient unable to give me a urine sample at this time

## 2019-08-20 NOTE — ED Triage Notes (Signed)
PER [facility] pt from school c/o upper abd pains and nausea that started about hour ago when she was walking down the sidewalk. Denies vomiting.  140 palpated, 90HR, 20R, 97%.

## 2019-08-20 NOTE — ED Provider Notes (Signed)
Patient not in room   Margarita Grizzle, MD 08/20/19 1235

## 2021-07-24 IMAGING — CT CT ABD-PELV W/ CM
2 of 4 series · 17 of 46 positions shown, 19 images · IV contrast (omnipaque)
Comparison: None.

CLINICAL DATA: Upper abdominal pain, nausea for 1 hour

EXAM:
CT ABDOMEN AND PELVIS WITH CONTRAST
TECHNIQUE: Multidetector CT imaging of the abdomen and pelvis was performed
using the standard protocol following bolus administration of
intravenous contrast.
CONTRAST:  100mL OMNIPAQUE IOHEXOL 300 MG/ML  SOLN

[Series 2: axial st · axial · 0.74mm/px · z∈[-401,+9]mm · 14 of 92 slices shown, 16 images]
[im 5/92  soft-tissue]
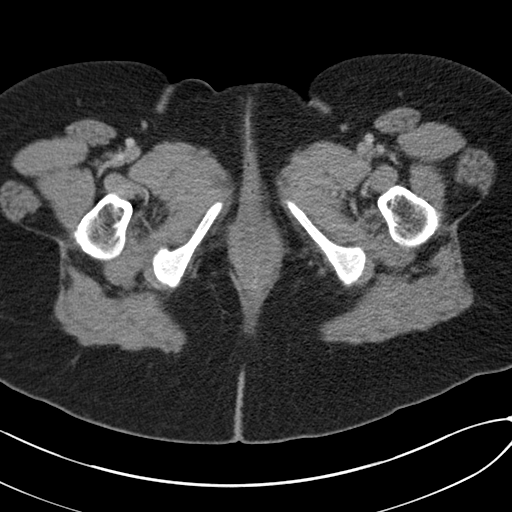
[im 5/92  bone]
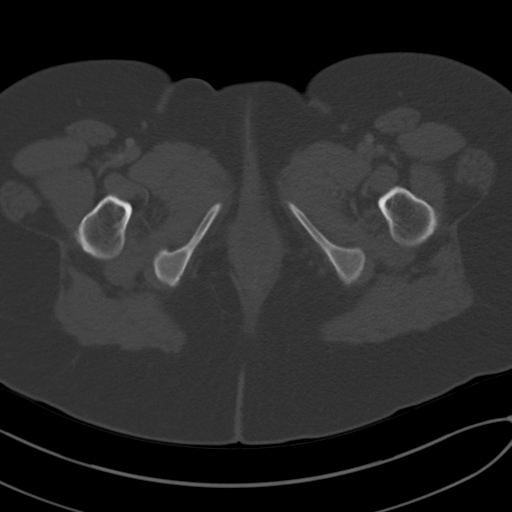
[im 14/92  soft-tissue]
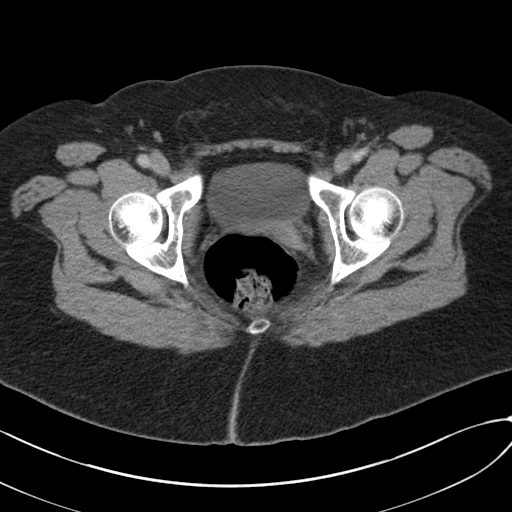
[im 19/92  soft-tissue]
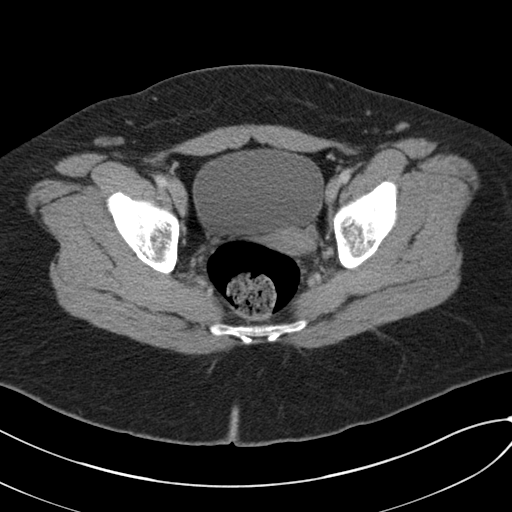
[im 23/92  soft-tissue]
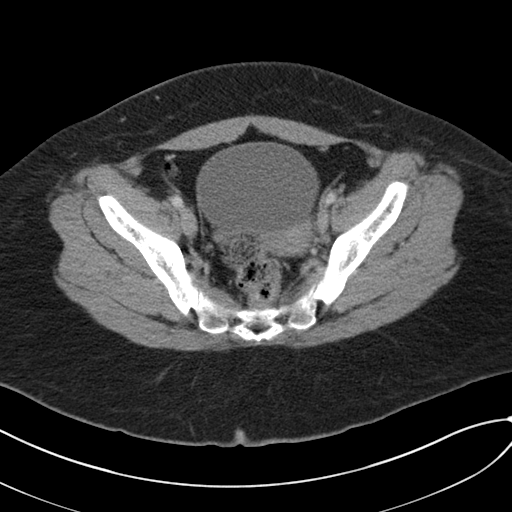
[im 32/92  soft-tissue]
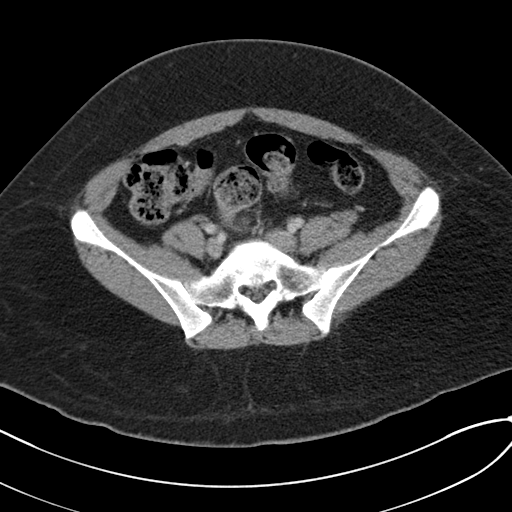
[im 37/92  soft-tissue]
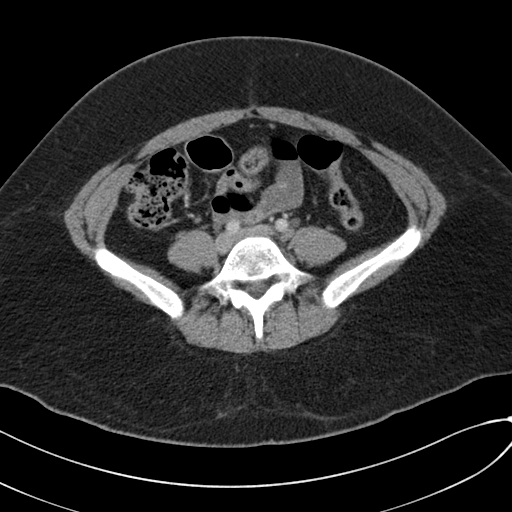
[im 41/92  soft-tissue]
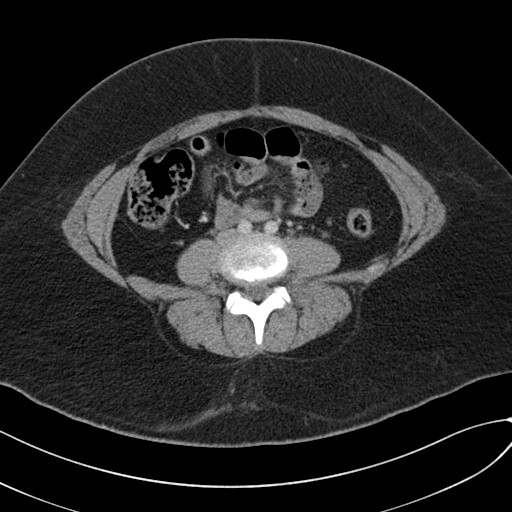
[im 51/92  soft-tissue]
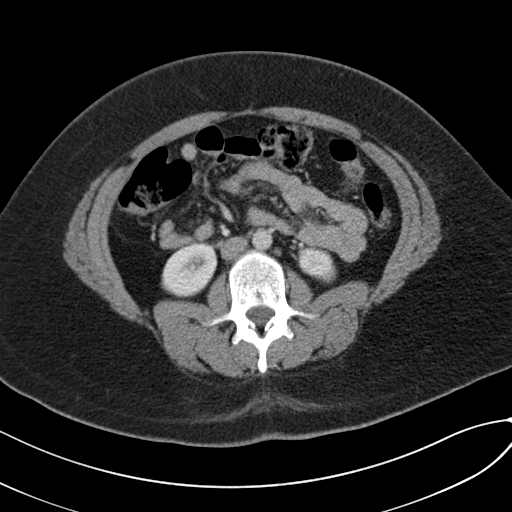
[im 55/92  soft-tissue]
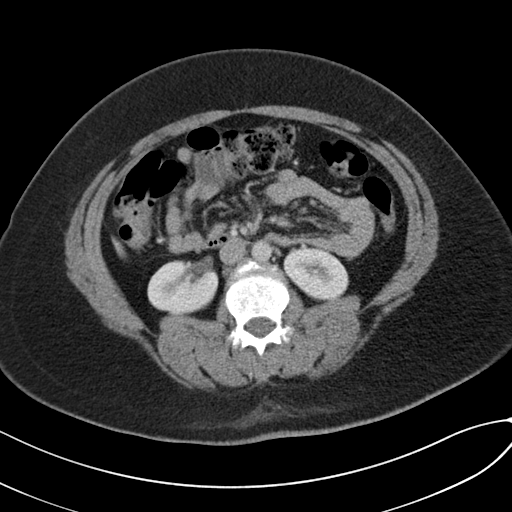
[im 55/92  bone]
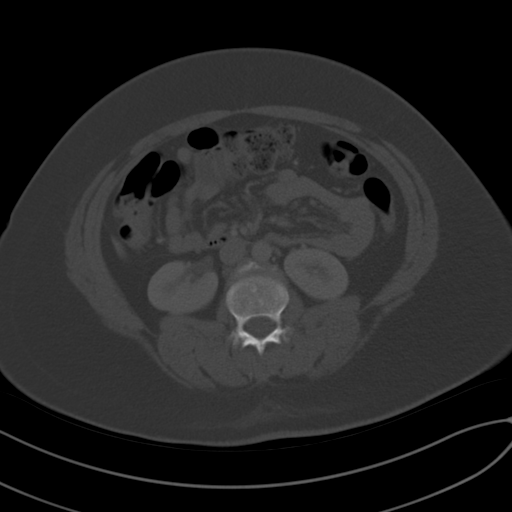
[im 60/92  soft-tissue]
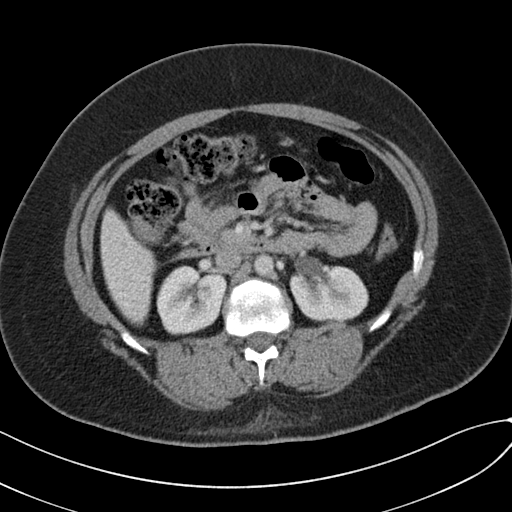
[im 69/92  soft-tissue]
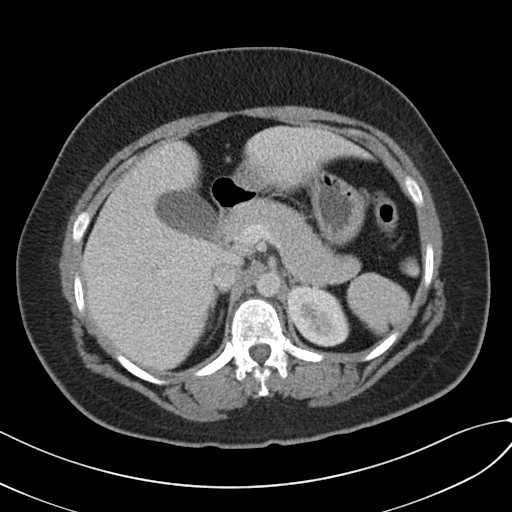
[im 73/92  soft-tissue]
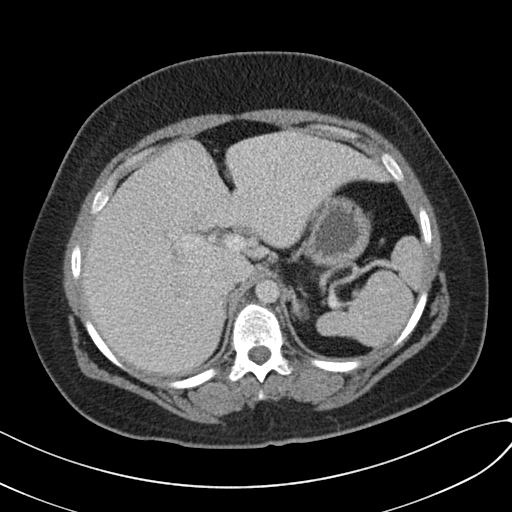
[im 78/92  soft-tissue]
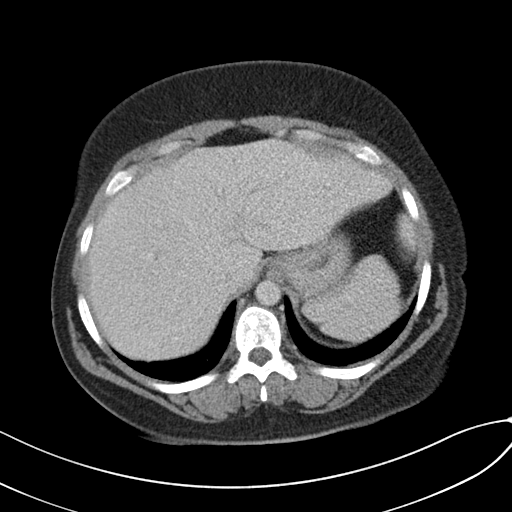
[im 87/92  soft-tissue]
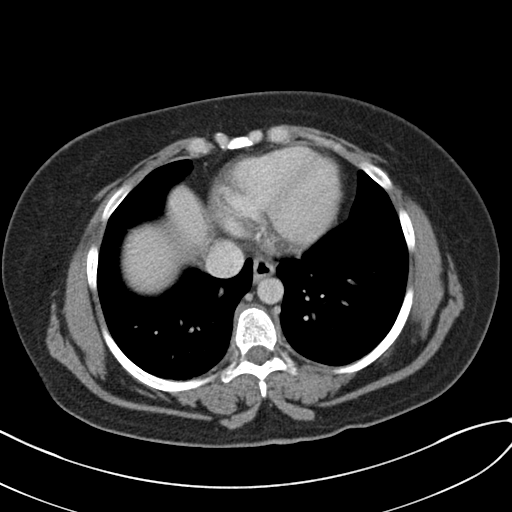

[Series 5: coronal st · coronal · 0.87mm/px · 3 of 152 slices shown]
[im 51/152  soft-tissue]
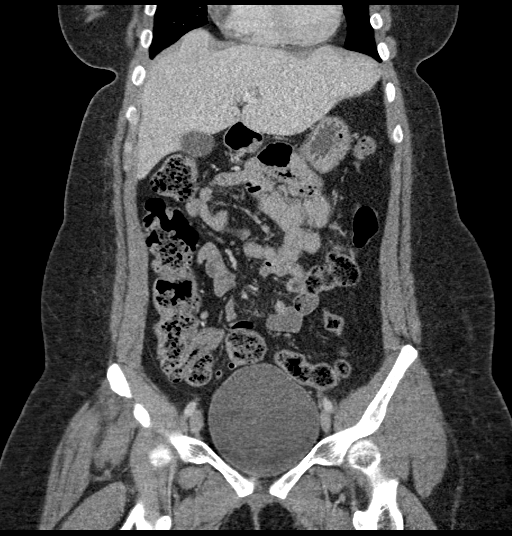
[im 68/152  soft-tissue]
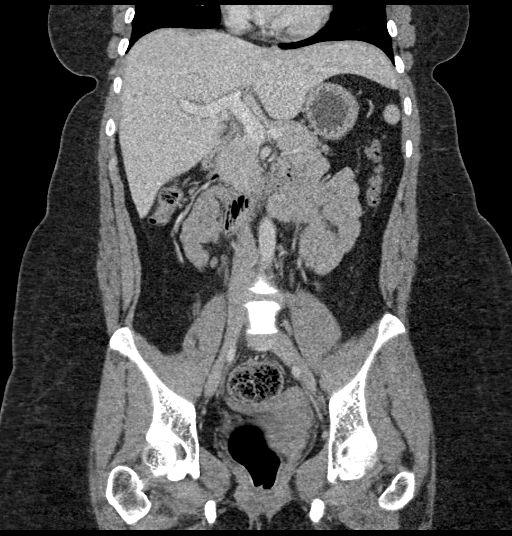
[im 84/152  soft-tissue]
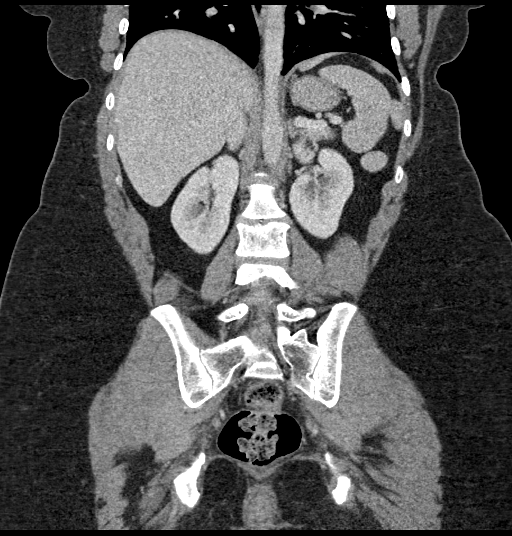

[17 of 46 positions shown; findings below may reference images not displayed]

FINDINGS: Lower chest: No acute abnormality.

Hepatobiliary: No solid liver abnormality is seen. No gallstones,
gallbladder wall thickening, or biliary dilatation.

Pancreas: Unremarkable. No pancreatic ductal dilatation or
surrounding inflammatory changes.

Spleen: Normal in size without significant abnormality.

Adrenals/Urinary Tract: Adrenal glands are unremarkable. Kidneys are
normal, without renal calculi, solid lesion, or hydronephrosis.
Bladder is unremarkable.

Stomach/Bowel: Stomach is within normal limits. Appendix appears
normal. No evidence of bowel wall thickening, distention, or
inflammatory changes.

Vascular/Lymphatic: No significant vascular findings are present. No
enlarged abdominal or pelvic lymph nodes.

Reproductive: No mass or other significant abnormality.

Other: No abdominal wall hernia or abnormality. No abdominopelvic
ascites.

Musculoskeletal: No acute or significant osseous findings.
IMPRESSION: No acute CT findings of the abdomen or pelvis to explain upper
abdominal pain or nausea.
